# Patient Record
Sex: Male | Born: 2000 | Race: Black or African American | Hispanic: No | Marital: Single | Smoking: Never smoker
Health system: Southern US, Community
[De-identification: ages and names within clinical notes are randomized; demographics above are authoritative.]

## PROBLEM LIST (undated history)

## (undated) DIAGNOSIS — J45909 Unspecified asthma, uncomplicated: Secondary | ICD-10-CM

## (undated) HISTORY — PX: TYMPANOSTOMY TUBE PLACEMENT: SHX32

---
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
## 2023-12-03 ENCOUNTER — Ambulatory Visit (HOSPITAL_COMMUNITY): Payer: 59
